# Patient Record
Sex: Female | Born: 1935 | Hispanic: Yes | Marital: Married | Smoking: Never smoker
Health system: Southern US, Community
[De-identification: ages and names within clinical notes are randomized; demographics above are authoritative.]

## PROBLEM LIST (undated history)

## (undated) DIAGNOSIS — E119 Type 2 diabetes mellitus without complications: Secondary | ICD-10-CM

## (undated) DIAGNOSIS — I10 Essential (primary) hypertension: Secondary | ICD-10-CM

## (undated) DIAGNOSIS — N289 Disorder of kidney and ureter, unspecified: Secondary | ICD-10-CM

## (undated) DIAGNOSIS — I639 Cerebral infarction, unspecified: Secondary | ICD-10-CM

## (undated) HISTORY — PX: BRAIN SURGERY: SHX531

---

## 2014-02-20 ENCOUNTER — Encounter (HOSPITAL_BASED_OUTPATIENT_CLINIC_OR_DEPARTMENT_OTHER): Payer: Self-pay

## 2014-02-20 ENCOUNTER — Emergency Department (HOSPITAL_BASED_OUTPATIENT_CLINIC_OR_DEPARTMENT_OTHER)
Admission: EM | Admit: 2014-02-20 | Discharge: 2014-02-20 | Disposition: A | Discharge status: Home / Self Care | Payer: Medicare HMO | Attending: Emergency Medicine | Admitting: Emergency Medicine

## 2014-02-20 ENCOUNTER — Emergency Department (HOSPITAL_BASED_OUTPATIENT_CLINIC_OR_DEPARTMENT_OTHER): Payer: Medicare HMO

## 2014-02-20 DIAGNOSIS — I10 Essential (primary) hypertension: Secondary | ICD-10-CM | POA: Diagnosis not present

## 2014-02-20 DIAGNOSIS — S0990XA Unspecified injury of head, initial encounter: Secondary | ICD-10-CM | POA: Insufficient documentation

## 2014-02-20 DIAGNOSIS — Y9389 Activity, other specified: Secondary | ICD-10-CM | POA: Diagnosis not present

## 2014-02-20 DIAGNOSIS — E119 Type 2 diabetes mellitus without complications: Secondary | ICD-10-CM | POA: Diagnosis not present

## 2014-02-20 DIAGNOSIS — W06XXXA Fall from bed, initial encounter: Secondary | ICD-10-CM | POA: Insufficient documentation

## 2014-02-20 DIAGNOSIS — Y998 Other external cause status: Secondary | ICD-10-CM | POA: Insufficient documentation

## 2014-02-20 DIAGNOSIS — Z79899 Other long term (current) drug therapy: Secondary | ICD-10-CM | POA: Insufficient documentation

## 2014-02-20 DIAGNOSIS — Y9289 Other specified places as the place of occurrence of the external cause: Secondary | ICD-10-CM | POA: Diagnosis not present

## 2014-02-20 DIAGNOSIS — Z8673 Personal history of transient ischemic attack (TIA), and cerebral infarction without residual deficits: Secondary | ICD-10-CM | POA: Insufficient documentation

## 2014-02-20 DIAGNOSIS — W19XXXA Unspecified fall, initial encounter: Secondary | ICD-10-CM

## 2014-02-20 HISTORY — DX: Cerebral infarction, unspecified: I63.9

## 2014-02-20 HISTORY — DX: Type 2 diabetes mellitus without complications: E11.9

## 2014-02-20 HISTORY — DX: Essential (primary) hypertension: I10

## 2014-02-20 LAB — CBG MONITORING, ED: Glucose-Capillary: 158 mg/dL — ABNORMAL HIGH (ref 70–99)

## 2014-02-20 NOTE — ED Provider Notes (Signed)
TIME SEEN: 10:40 AM  CHIEF COMPLAINT: Fall out of bed  HPI: Pt is a 80 y.o. female with history of hemorrhagic stroke with left-sided hemiplegia, hypertension, diabetes who lives with her daughter who fell out of bed this morning on the floor. Daughter reports that she heard a loud crash and then when on stairs and down the patient conscious on the floor. Patient denies any pain in the daughter noted some right-sided conjunctival injection was concerned. She is not on anticoagulation. No new numbness or focal weakness. No chest pain, abdominal pain, neck or back pain. No recent fevers, cough, vomiting or diarrhea. Patient is confused at baseline. Daughter reports she is acting normally.  ROS: Level V caveat for baseline altered mental status  PAST MEDICAL HISTORY/PAST SURGICAL HISTORY:  Past Medical History  Diagnosis Date  . CVA (cerebral infarction)   . Stroke   . Hypertension   . Diabetes mellitus without complication     MEDICATIONS:  Prior to Admission medications   Medication Sig Start Date End Date Taking? Authorizing Provider  atenolol (TENORMIN) 50 MG tablet Take 50 mg by mouth daily.   Yes Historical Provider, MD  atenolol (TENORMIN) 50 MG tablet Take 50 mg by mouth daily.   Yes Historical Provider, MD  atorvastatin (LIPITOR) 40 MG tablet Take 40 mg by mouth daily.   Yes Historical Provider, MD  furosemide (LASIX) 40 MG tablet Take 40 mg by mouth.   Yes Historical Provider, MD  hydrochlorothiazide (HYDRODIURIL) 25 MG tablet Take 25 mg by mouth daily.   Yes Historical Provider, MD  sertraline (ZOLOFT) 25 MG tablet Take 25 mg by mouth daily.   Yes Historical Provider, MD    ALLERGIES:  No Known Allergies  SOCIAL HISTORY:  History  Substance Use Topics  . Smoking status: Never Smoker   . Smokeless tobacco: Not on file  . Alcohol Use: Not on file    FAMILY HISTORY: No family history on file.  EXAM: BP 138/53 mmHg  Pulse 69  Temp(Src) 98.3 F (36.8 C) (Oral)  Resp  18  SpO2 100% CONSTITUTIONAL: Alert and oriented to person only, disoriented to year and place which daughter reports is chronic, responds appropriately to questions intermittently. Well-appearing; well-nourished; GCS 15, in NAD HEAD: Normocephalic; atraumatic EYES: Mild right-sided conjunctival injection, no drainage, no conjunctival injection on the left, no hyphema, PERRL, EOMI, no conjunctival pallor ENT: normal nose; no rhinorrhea; moist mucous membranes; pharynx without lesions noted; no dental injury; no hemotypanum; no septal hematoma NECK: Supple, no meningismus, no LAD; no midline spinal tenderness, step-off or deformity CARD: RRR; S1 and S2 appreciated; no murmurs, no clicks, no rubs, no gallops RESP: Normal chest excursion without splinting or tachypnea; breath sounds clear and equal bilaterally; no wheezes, no rhonchi, no rales; chest wall stable, nontender to palpation ABD/GI: Normal bowel sounds; non-distended; soft, non-tender, no rebound, no guarding PELVIS:  stable, nontender to palpation BACK:  The back appears normal and is non-tender to palpation, there is no CVA tenderness; no midline spinal tenderness, step-off or deformity EXT: Normal ROM in all joints; non-tender to palpation; no edema; normal capillary refill; no cyanosis    SKIN: Normal color for age and race; warm NEURO: Left-sided hemiplegia that is chronic, otherwise moves right side normally, no cranial nerve deficit, no dysarthria or aphasia, reports normal sensation diffusely PSYCH: The patient's mood and manner are appropriate. Grooming and personal hygiene are appropriate.  MEDICAL DECISION MAKING: Patient here with a fall out of bed. Patient's daughter  reports she heard a loud crash and then found her on the floor. She is at her neurologic baseline without complaints of pain. Given her age and comorbidities will obtain a CT of her head and cervical spine. Daughter is concerned that her blood sugar may be off that  she recently had a colonoscopy and would like for [place] to check this today. We'll check a CBG. Patient does not appear dehydrated on exam and has been eating and drinking well. Her abdominal exam is completely benign.  ED PROGRESS: Patient's blood glucose is normal. Imaging shows no acute abnormality. On repeat exam of her eyes she states this has very mild right-sided conjunctival injection but no other signs or symptoms to suggest corneal abrasion, conjunctivitis. I do not feel this time she needs to be a antibiotics. Reports no eye pain. Discussed head injury return precautions. Patient's daughter verbalized understanding and is comfortable with plan.     Shannon Maw Isiac Breighner, DO 02/20/14 1530

## 2014-02-20 NOTE — Discharge Instructions (Signed)
Lesin en la cabeza (Head Injury) Ha sufrido una lesin en la cabeza. En este momento no parece ser de gravedad. Los dolores de Turkmenistancabeza y los vmitos son frecuentes luego de este tipo de lesiones. Si se duerme, debera resultar fcil despertarlo. A veces, es necesario que permanezca en la sala de emergencia durante un tiempo para su observacin. Tambin puede ser necesario hospitalizarlo. Despus de lesiones como la que usted sufri, la mayora de los problemas ocurre dentro de las primeras 24horas, pero los efectos secundarios pueden aparecer entre 7 y 10das despus de la lesin. Es importante que controle cuidadosamente su problema y que se comunique con su mdico o busque atencin mdica de inmediato si observa algn cambio en su estado. CULES SON LOS TIPOS DE LESIONES EN LA CABEZA? Las lesiones en la cabeza pueden ser leves y provocar un bulto. Algunas lesiones en la cabeza pueden ser ms graves. Algunas de las lesiones graves en la cabeza son:  Helene KelpLesin agresiva en el cerebro (conmocin).  Hematoma en el cerebro (contusin). Esto significa que hay hemorragia en el cerebro que puede causar un edema.  Fisura en el crneo (fractura de crneo).  Hemorragia en el cerebro que junta sangre, coagula y forma un bulto (hematoma). CULES SON LAS CAUSAS DE UNA LESIN EN LA CABEZA? Es ms probable que una lesin en la cabeza grave le ocurra a alguien que sufre un accidente automovilstico y no est usando el cinturn de seguridad. Otras causas de lesiones importantes en la cabeza incluyen accidentes en bicicleta o motocicleta, lesiones deportivas y cadas. CMO SE DIAGNOSTICAN LAS LESIONES EN LA CABEZA? Un historial completo del evento que deriv en la lesin y sus sntomas actuales sern tiles para el diagnstico de lesiones en la cabeza. Muchas veces, se necesitar tomar imgenes del cerebro, como tomografa computarizada o resonancia magntica, para conocer la magnitud de la lesin. A menudo se debe  pasar una noche entera en el hospital para observacin.  CUNDO DEBO BUSCAR ASISTENCIA MDICA INMEDIATA?  Debe obtener ayuda de inmediato en los siguientes casos:  Est confundido o somnoliento.  Siente Programme researcher, broadcasting/film/videomalestar estomacal (nuseas) o tiene vmitos constantes y forzosos.  Nota que los mareos o la inestabilidad empeoran.  Siente dolores de Turkmenistancabeza intensos y persistentes que no se Copyalivian con los medicamentos. Utilice los medicamentos de venta libre o recetados para Primary school teachercalmar el dolor, el malestar o la fiebre, segn se lo indique el mdico.  Las piernas o los brazos no funcionan normalmente o no Hydrographic surveyorpuede caminar.  Observa cambios en los puntos negros en el centro de la parte de color del ojo (pupila).  Presenta una secrecin clara o con sangre que proviene de la nariz o de los odos.  Sufre prdida de la visin. Durante las prximas 24horas posteriores a la lesin, Office managerdebe permanecer con Designer, industrial/productalguna persona que pueda cuidarlo y estar atento a los signos de Customer service manageradvertencia. Esta persona debe comunicarse con el servicio de emergencias de su localidad (911 en los EE[place] si usted tiene convulsiones, est inconsciente o no puede despertarse. CMO PUEDO PREVENIR UNA LESIN EN LA CABEZA EN EL FUTURO? El factor ms importante para prevenir lesiones en la cabeza de gravedad es evitar los accidentes en vehculos a motor. Para minimizar el dao potencial a la cabeza, es crucial usar cinturones de seguridad. Tambin es til usar casco si anda en bicicleta y Therapist, occupationalpractica deportes de contacto (como el ftbol Public house manageramericano). Adems, evite las actividades peligrosas en su casa para ayudar a reducir el riesgo de sufrir una lesin en  la cabeza.  CUNDO PUEDO RETOMAR LAS ACTIVIDADES NORMALES Y EL ATLETISMO? Antes de retomar estas actividades, su mdico debe volver a evaluarlo. Si presenta alguno de los siguientes sntomas, no podr retomar sus actividades ni volver a Microbiologistpracticar deportes de contacto hasta una semana despus de que los  sntomas hayan desaparecido:  Dolor de cabeza persistente.  Mareos o vrtigo.  Falta de atencin y Librarian, academicconcentracin.  Confusin.  Problemas de memoria.  Nuseas o vmitos.  Siente fatiga o se cansa fcilmente.  Irritabilidad.  Intolerancia a la luz brillante y a los ruidos fuertes.  Ansiedad o depresin.  Trastornos del sueo ASEGRESE DE QUE:   Comprende estas instrucciones.  Controlar su afeccin.  Recibir ayuda de inmediato si no mejora o si empeora. Document Released: 01/22/2005 Document Revised: 01/27/2013 [facility] CenterExitCare Patient Information 2015 [place]Care, [place]. This information is not intended to replace advice given to you by your health care provider. Make sure you discuss any questions you have with your health care provider.  Prevencin de las cadas y seguridad en Advice workerel hogar  (Fall Prevention and Financial risk analystHome Safety)  Las cadas causan lesiones y Futures traderpueden afectar a personas de todas las edades. Es posible prevenir las cadas.  CMO PREVENIR LAS CADAS   Use zapatos con suela de goma que no tengan abertura para los dedos del pie.  Mantenga el interior y Dance movement psychotherapistel exterior de su casa bien iluminada.  Utilice luces de noche en su hogar.  No acumule desorden en los pisos.  Limpie los derrames del suelo.  Retire las alfombras o fjelas al suelo con cinta adhesiva para alfombras.  No  colocar cables elctricos en los pasillos.  Coloque agarraderas en la baera, la ducha y el inodoro. No utilizar toalleros como barras de agarre.  Coloque barandas a ambos lados de Dispensing opticianla escalera. Arregle las barandas sueltas.  No se suba a bancos o escaleras de tijera, en lo posible.  No encere los pisos.  Repare aceras, calzadas o escaleras irregulares o inseguras.  Mantenga los artculos que [place]util[place] habitualmente dentro de su alcance.  Tenga cuidado con las [employer].  Tenga los nmeros de emergencia cerca del telfono.  Coloque detectores de humo en su casa y cerca de los  dormitorios. Pregntele a su mdico qu otras cosas puede hacer para prevenir las cadas.  Document Released: 07/24/2011 [facility]ExitCare Patient Information 2015 [place]ExitCare, [place]LLC. This information is not intended to replace advice given to you by your health care provider. Make sure you discuss any questions you have with your health care provider.

## 2014-02-20 NOTE — ED Notes (Addendum)
Patient here with daughter who found patient in floor this am. Patient had Stroke in 2013 and has some deficits from same. On arrival no deficts noted, patient denies pain. Confused about todays date but daughter reports that she is sometimes confused with same. Right eye redness noted

## 2014-10-09 ENCOUNTER — Emergency Department (HOSPITAL_BASED_OUTPATIENT_CLINIC_OR_DEPARTMENT_OTHER)
Admission: EM | Admit: 2014-10-09 | Discharge: 2014-10-09 | Disposition: A | Discharge status: Home / Self Care | Payer: Medicare HMO | Attending: Emergency Medicine | Admitting: Emergency Medicine

## 2014-10-09 ENCOUNTER — Encounter (HOSPITAL_BASED_OUTPATIENT_CLINIC_OR_DEPARTMENT_OTHER): Payer: Self-pay | Admitting: Emergency Medicine

## 2014-10-09 ENCOUNTER — Emergency Department (HOSPITAL_BASED_OUTPATIENT_CLINIC_OR_DEPARTMENT_OTHER): Payer: Medicare HMO

## 2014-10-09 DIAGNOSIS — I10 Essential (primary) hypertension: Secondary | ICD-10-CM | POA: Diagnosis not present

## 2014-10-09 DIAGNOSIS — R200 Anesthesia of skin: Secondary | ICD-10-CM | POA: Insufficient documentation

## 2014-10-09 DIAGNOSIS — N390 Urinary tract infection, site not specified: Secondary | ICD-10-CM | POA: Diagnosis not present

## 2014-10-09 DIAGNOSIS — R21 Rash and other nonspecific skin eruption: Secondary | ICD-10-CM | POA: Diagnosis present

## 2014-10-09 DIAGNOSIS — R5383 Other fatigue: Secondary | ICD-10-CM | POA: Diagnosis not present

## 2014-10-09 DIAGNOSIS — R630 Anorexia: Secondary | ICD-10-CM | POA: Diagnosis not present

## 2014-10-09 DIAGNOSIS — Z79899 Other long term (current) drug therapy: Secondary | ICD-10-CM | POA: Diagnosis not present

## 2014-10-09 DIAGNOSIS — B019 Varicella without complication: Secondary | ICD-10-CM | POA: Insufficient documentation

## 2014-10-09 DIAGNOSIS — R0602 Shortness of breath: Secondary | ICD-10-CM | POA: Insufficient documentation

## 2014-10-09 DIAGNOSIS — E119 Type 2 diabetes mellitus without complications: Secondary | ICD-10-CM | POA: Diagnosis not present

## 2014-10-09 DIAGNOSIS — B0189 Other varicella complications: Secondary | ICD-10-CM

## 2014-10-09 DIAGNOSIS — M791 Myalgia: Secondary | ICD-10-CM | POA: Insufficient documentation

## 2014-10-09 DIAGNOSIS — Z8673 Personal history of transient ischemic attack (TIA), and cerebral infarction without residual deficits: Secondary | ICD-10-CM | POA: Insufficient documentation

## 2014-10-09 LAB — CBC WITH DIFFERENTIAL/PLATELET
BASOS PCT: 0 % (ref 0–1)
Basophils Absolute: 0 10*3/uL (ref 0.0–0.1)
Eosinophils Absolute: 0.1 10*3/uL (ref 0.0–0.7)
Eosinophils Relative: 2 % (ref 0–5)
HCT: 36.8 % (ref 36.0–46.0)
Hemoglobin: 11.9 g/dL — ABNORMAL LOW (ref 12.0–15.0)
LYMPHS ABS: 1.6 10*3/uL (ref 0.7–4.0)
LYMPHS PCT: 35 % (ref 12–46)
MCH: 29.7 pg (ref 26.0–34.0)
MCHC: 32.3 g/dL (ref 30.0–36.0)
MCV: 91.8 fL (ref 78.0–100.0)
Monocytes Absolute: 0.9 10*3/uL (ref 0.1–1.0)
Monocytes Relative: 19 % — ABNORMAL HIGH (ref 3–12)
NEUTROS PCT: 43 % (ref 43–77)
Neutro Abs: 1.9 10*3/uL (ref 1.7–7.7)
PLATELETS: 123 10*3/uL — AB (ref 150–400)
RBC: 4.01 MIL/uL (ref 3.87–5.11)
RDW: 13.8 % (ref 11.5–15.5)
WBC: 4.4 10*3/uL (ref 4.0–10.5)

## 2014-10-09 LAB — BASIC METABOLIC PANEL
ANION GAP: 7 (ref 5–15)
BUN: 27 mg/dL — ABNORMAL HIGH (ref 6–20)
CO2: 29 mmol/L (ref 22–32)
Calcium: 8.4 mg/dL — ABNORMAL LOW (ref 8.9–10.3)
Chloride: 102 mmol/L (ref 101–111)
Creatinine, Ser: 1.26 mg/dL — ABNORMAL HIGH (ref 0.44–1.00)
GFR calc Af Amer: 46 mL/min — ABNORMAL LOW (ref >60)
GFR, EST NON AFRICAN AMERICAN: 40 mL/min — AB (ref >60)
GLUCOSE: 144 mg/dL — AB (ref 65–99)
POTASSIUM: 4 mmol/L (ref 3.5–5.1)
Sodium: 138 mmol/L (ref 135–145)

## 2014-10-09 LAB — URINALYSIS, ROUTINE W REFLEX MICROSCOPIC
Bilirubin Urine: NEGATIVE
Glucose, UA: NEGATIVE mg/dL
Ketones, ur: NEGATIVE mg/dL
NITRITE: NEGATIVE
PH: 5 (ref 5.0–8.0)
Protein, ur: NEGATIVE mg/dL
SPECIFIC GRAVITY, URINE: 1.016 (ref 1.005–1.030)
Urobilinogen, UA: 0.2 mg/dL (ref 0.0–1.0)

## 2014-10-09 LAB — URINE MICROSCOPIC-ADD ON

## 2014-10-09 MED ORDER — CLOBETASOL PROP EMOLLIENT BASE 0.05 % EX CREA: 60.0000 g | TOPICAL_CREAM | Freq: Two times a day (BID) | CUTANEOUS | Status: DC

## 2014-10-09 MED ORDER — LEVOFLOXACIN 500 MG PO TABS: 500.0000 mg | ORAL_TABLET | Freq: Every day | ORAL | Status: AC

## 2014-10-09 NOTE — ED Provider Notes (Signed)
CSN: [id number]     Arrival date & time 10/09/14  1309 History   First MD Initiated Contact with Patient 10/09/14 1334     Chief Complaint  Patient presents with  . Rash  . Fever   HPI   Very friendly 80 year old lady h/o [facility] June 2015 presents with low grade fever of 99.9, malaise, and new-onset blistering rash on trunk and arms; all symptoms started last night. Her daughter translated our conversation. She denies any new medications and has never had a rash like this before. She has never had chicken pox before. She lives with her daughter and attends an elderly daycare but has not been near children anytime recently.  Past Medical History  Diagnosis Date  . CVA (cerebral infarction)   . Stroke   . Hypertension   . Diabetes mellitus without complication    History reviewed. No pertinent past surgical history. History reviewed. No pertinent family history. Social History  Substance Use Topics  . Smoking status: Never Smoker   . Smokeless tobacco: None  . Alcohol Use: None   OB History    No data available     Review of Systems  Constitutional: Positive for fever, activity change, appetite change and fatigue.  Respiratory: Positive for shortness of breath. Negative for cough, chest tightness and wheezing.   Cardiovascular: Negative for chest pain.  Gastrointestinal: Negative for nausea, vomiting, abdominal pain and diarrhea.  Genitourinary: Negative for dysuria and pelvic pain.  Musculoskeletal: Positive for myalgias.  Skin: Positive for rash.  Neurological: Positive for numbness. Negative for light-headedness.    Allergies  Review of patient's allergies indicates no known allergies.  Home Medications   Prior to Admission medications   Medication Sig Start Date End Date Taking? Authorizing Provider  atenolol (TENORMIN) 50 MG tablet Take 50 mg by mouth daily.    Historical Provider, MD  atorvastatin (LIPITOR) 40 MG tablet Take 40 mg by mouth daily.    Historical  Provider, MD  furosemide (LASIX) 40 MG tablet Take 40 mg by mouth.    Historical Provider, MD  hydrochlorothiazide (HYDRODIURIL) 25 MG tablet Take 25 mg by mouth daily.    Historical Provider, MD  sertraline (ZOLOFT) 25 MG tablet Take 25 mg by mouth daily.    Historical Provider, MD   BP 136/47 mmHg  Pulse 65  Temp(Src) 98.7 F (37.1 C) (Oral)  Resp 18  SpO2 95%   Physical Exam  Constitutional: She appears well-developed and well-nourished.  HENT:  Head: Normocephalic and atraumatic.  Eyes: Conjunctivae and EOM are normal. Pupils are equal, round, and reactive to light.  Neck: Normal range of motion. Neck supple.  Cardiovascular: Normal rate, regular rhythm and normal heart sounds.   Pulmonary/Chest: Effort normal. She has rales.  Abdominal: Soft. Bowel sounds are normal.  Skin:  Multiple vesicles on erythematous bases distributed on neck, trunk, and extremities. No oral lesions. No nail changes.    ED Course  Procedures (including critical care time)  Labs Review Labs Reviewed  URINALYSIS, ROUTINE W REFLEX MICROSCOPIC (NOT AT [facility]) - Abnormal; Notable for the following:    APPearance CLOUDY (*)    Hgb urine dipstick LARGE (*)    Leukocytes, UA SMALL (*)    All other components within normal limits  CBC WITH DIFFERENTIAL/PLATELET - Abnormal; Notable for the following:    Hemoglobin 11.9 (*)    Platelets 123 (*)    Monocytes Relative 19 (*)    All other components within normal limits  BASIC METABOLIC PANEL - Abnormal; Notable for the following:    Glucose, Bld 144 (*)    BUN 27 (*)    Creatinine, Ser 1.26 (*)    Calcium 8.4 (*)    GFR calc non Af Amer 40 (*)    GFR calc Af Amer 46 (*)    All other components within normal limits  URINE MICROSCOPIC-ADD ON - Abnormal; Notable for the following:    Squamous Epithelial / LPF FEW (*)    Bacteria, UA FEW (*)    All other components within normal limits    Imaging Review No results found. I have personally reviewed  and evaluated these images and lab results as part of my medical decision-making.  EKG: Sinus bradycardia, no ST changes  MDM   Final diagnoses:  Bullous dermatosis   Ms. Hermie Reagor is a very friendly spanish-speaking 80 year old lady with a history of subarachnoid hemorrhage presenting with one day history of low grade fever, malaise, and diffuse vesicles on an erythematous base, concerning for chicken pox versus bullous pemphigus. I favor the former diagnosis given her systemic symptoms, morphology of lesions being more vesicular than bullous, and the fact that she is [place] and has reportedly never had chicken pox before. VZV in elderly patients has a strong association with pneumonia so she will need close follow-up. Giant cell temporal arteritis is also  thus this diagnosis should be considered if she develops change is vision or temporal headache with tender temporal artery. Bullous pemphigoid is another consideration giver her age, morphology of tense vesicles, and urticarial lesions that may represent new lesions that have not yet blistered.  She also had a urinalysis with moderate leukocytes and CXR showing LLL atelectasis versus consolidation. Will rx levofloxacin for 5 days empirically to cover UTI and pneumonia.  Selina Cooley, MD 10/09/14 1610  Rolan Bucco, MD 10/10/14 1018

## 2014-10-09 NOTE — ED Notes (Signed)
Pt in with rash noted to anterior and posterior trunk and face, daughter states her temp was 99.9 this am. Daughter also states pt was dizzy this am but is able to ambulate. Pt with hx of hemorrhagic stroke in 2013, mild residual deficits to L side. Pt is neurologically intact at this time with no facial droop, new unilateral weakness, dysarthria or aphasia.

## 2014-10-09 NOTE — ED Notes (Signed)
MD at bedside. 

## 2014-10-09 NOTE — ED Notes (Signed)
Pt daughter states she gave pt two Tylenol tablets this around 1030 am when she discovered the fever.

## 2014-10-09 NOTE — ED Notes (Signed)
Daughter states mother has onset of rash since last PM, all over, trunk, arms, chin. Also has had poor appetite

## 2015-01-06 ENCOUNTER — Encounter (HOSPITAL_BASED_OUTPATIENT_CLINIC_OR_DEPARTMENT_OTHER): Payer: Self-pay

## 2015-01-06 ENCOUNTER — Emergency Department (HOSPITAL_BASED_OUTPATIENT_CLINIC_OR_DEPARTMENT_OTHER)
Admission: EM | Admit: 2015-01-06 | Discharge: 2015-01-06 | Disposition: A | Discharge status: Home / Self Care | Payer: Medicare HMO | Attending: Emergency Medicine | Admitting: Emergency Medicine

## 2015-01-06 DIAGNOSIS — I129 Hypertensive chronic kidney disease with stage 1 through stage 4 chronic kidney disease, or unspecified chronic kidney disease: Secondary | ICD-10-CM | POA: Insufficient documentation

## 2015-01-06 DIAGNOSIS — Z8673 Personal history of transient ischemic attack (TIA), and cerebral infarction without residual deficits: Secondary | ICD-10-CM | POA: Diagnosis not present

## 2015-01-06 DIAGNOSIS — I10 Essential (primary) hypertension: Secondary | ICD-10-CM

## 2015-01-06 DIAGNOSIS — N183 Chronic kidney disease, stage 3 (moderate): Secondary | ICD-10-CM | POA: Diagnosis not present

## 2015-01-06 DIAGNOSIS — R350 Frequency of micturition: Secondary | ICD-10-CM | POA: Diagnosis not present

## 2015-01-06 DIAGNOSIS — N289 Disorder of kidney and ureter, unspecified: Secondary | ICD-10-CM | POA: Insufficient documentation

## 2015-01-06 DIAGNOSIS — E119 Type 2 diabetes mellitus without complications: Secondary | ICD-10-CM | POA: Diagnosis not present

## 2015-01-06 HISTORY — DX: Disorder of kidney and ureter, unspecified: N28.9

## 2015-01-06 LAB — URINALYSIS, ROUTINE W REFLEX MICROSCOPIC
Bilirubin Urine: NEGATIVE
GLUCOSE, UA: NEGATIVE mg/dL
KETONES UR: NEGATIVE mg/dL
Nitrite: NEGATIVE
PROTEIN: NEGATIVE mg/dL
Specific Gravity, Urine: 1.011 (ref 1.005–1.030)
pH: 7 (ref 5.0–8.0)

## 2015-01-06 LAB — CBC WITH DIFFERENTIAL/PLATELET
BASOS ABS: 0 10*3/uL (ref 0.0–0.1)
BASOS PCT: 1 %
EOS ABS: 0.3 10*3/uL (ref 0.0–0.7)
EOS PCT: 3 %
HCT: 39.2 % (ref 36.0–46.0)
Hemoglobin: 12.8 g/dL (ref 12.0–15.0)
Lymphocytes Relative: 32 %
Lymphs Abs: 2.6 10*3/uL (ref 0.7–4.0)
MCH: 29.4 pg (ref 26.0–34.0)
MCHC: 32.7 g/dL (ref 30.0–36.0)
MCV: 90.1 fL (ref 78.0–100.0)
MONO ABS: 0.7 10*3/uL (ref 0.1–1.0)
Monocytes Relative: 8 %
Neutro Abs: 4.7 10*3/uL (ref 1.7–7.7)
Neutrophils Relative %: 56 %
PLATELETS: 220 10*3/uL (ref 150–400)
RBC: 4.35 MIL/uL (ref 3.87–5.11)
RDW: 14.2 % (ref 11.5–15.5)
WBC: 8.3 10*3/uL (ref 4.0–10.5)

## 2015-01-06 LAB — BASIC METABOLIC PANEL
ANION GAP: 12 (ref 5–15)
BUN: 27 mg/dL — ABNORMAL HIGH (ref 6–20)
CO2: 26 mmol/L (ref 22–32)
Calcium: 9.6 mg/dL (ref 8.9–10.3)
Chloride: 102 mmol/L (ref 101–111)
Creatinine, Ser: 1.05 mg/dL — ABNORMAL HIGH (ref 0.44–1.00)
GFR, EST AFRICAN AMERICAN: 57 mL/min — AB (ref >60)
GFR, EST NON AFRICAN AMERICAN: 50 mL/min — AB (ref >60)
Glucose, Bld: 106 mg/dL — ABNORMAL HIGH (ref 65–99)
POTASSIUM: 4.3 mmol/L (ref 3.5–5.1)
SODIUM: 140 mmol/L (ref 135–145)

## 2015-01-06 LAB — URINE MICROSCOPIC-ADD ON

## 2015-01-06 NOTE — ED Notes (Signed)
Daughter reports patient with HTN over the past three days, 200/90, despite home antihypertensives. Denies falling, walking with walker at home, reports intermittent headache. Denies chest pain. Daughter reports giving extra antihypertensives over the past two days in attempts to control blood pressure that has been elevated in the evening. Pt has had her antihypertensives this morning.

## 2015-01-06 NOTE — ED Provider Notes (Signed)
CSN: [id number]     Arrival date & time 01/06/15  [id number] History   First MD Initiated Contact with Patient 01/06/15 0902     Chief Complaint  Patient presents with  . Hypertension     (Consider location/radiation/quality/duration/timing/severity/associated sxs/prior Treatment) HPI Comments: Has not changed her medication and daughter has made sure she gets all he meds.  Patient is a 80 y.o. female presenting with hypertension. The history is provided by a caregiver. The history is limited by a language barrier. A language interpreter was used.  Hypertension This is a new problem. Episode onset: 3 days. The problem occurs constantly. The problem has not changed since onset.Associated symptoms include headaches. Pertinent negatives include no chest pain, no abdominal pain and no shortness of breath. Associated symptoms comments: No N/V.  Always has urinary frequency due to taking lasix but nothing new.  No dysuria.  Appetite is normal and no recent swelling or weight gain.  Pt's diet has been different over the holidays.  Daughter states she has been eating a lot of stuff from thanksgiving that she ordinarily does not eat.  Chronic left sided weakness but no new deficits.  Speaking and swallowing without difficulty.  Normal vision.. Nothing aggravates the symptoms. Nothing relieves the symptoms. She has tried nothing for the symptoms. The treatment provided no relief.    Past Medical History  Diagnosis Date  . CVA (cerebral infarction)   . Stroke (HCC)   . Hypertension   . Diabetes mellitus without complication (HCC)   . Renal disorder     stg 3   Past Surgical History  Procedure Laterality Date  . Brain surgery     History reviewed. No pertinent family history. Social History  Substance Use Topics  . Smoking status: Never Smoker   . Smokeless tobacco: None  . Alcohol Use: No   OB History    No data available     Review of Systems  Respiratory: Negative for shortness of breath.    Cardiovascular: Negative for chest pain.  Gastrointestinal: Negative for abdominal pain.  Neurological: Positive for headaches.  All other systems reviewed and are negative.     Allergies  Review of patient's allergies indicates no known allergies.  Home Medications   Prior to Admission medications   Medication Sig Start Date End Date Taking? Authorizing Provider  atenolol (TENORMIN) 50 MG tablet Take 50 mg by mouth daily.   Yes Historical Provider, MD  atorvastatin (LIPITOR) 40 MG tablet Take 40 mg by mouth daily.   Yes Historical Provider, MD  hydrochlorothiazide (HYDRODIURIL) 25 MG tablet Take 25 mg by mouth daily.   Yes Historical Provider, MD  furosemide (LASIX) 40 MG tablet Take 40 mg by mouth.    Historical Provider, MD  sertraline (ZOLOFT) 25 MG tablet Take 25 mg by mouth daily.    Historical Provider, MD   BP 173/63 mmHg  Pulse 60  Temp(Src) 98.1 F (36.7 C) (Oral)  Resp 18  SpO2 100% Physical Exam  Constitutional: She is oriented to person, place, and time. She appears well-developed and well-nourished. No distress.  HENT:  Head: Normocephalic and atraumatic.  Mouth/Throat: Oropharynx is clear and moist.  Eyes: Conjunctivae and EOM are normal. Pupils are equal, round, and reactive to light.  Neck: Normal range of motion. Neck supple.  Cardiovascular: Normal rate, regular rhythm and intact distal pulses.   No murmur heard. Pulmonary/Chest: Effort normal and breath sounds normal. No respiratory distress. She has no wheezes. She has no rales.  Abdominal: Soft. She exhibits no distension. There is no tenderness. There is no rebound and no guarding.  Musculoskeletal: Normal range of motion. She exhibits no edema or tenderness.  Neurological: She is alert and oriented to person, place, and time.  4/5 strength in the left upper and lower ext.  5/5 strength on the right.  Normal speech.  Skin: Skin is warm and dry. No rash noted. No erythema.  Psychiatric: She has a  normal mood and affect. Her behavior is normal.  Nursing note and vitals reviewed.   ED Course  Procedures (including critical care time) Labs Review Labs Reviewed  BASIC METABOLIC PANEL - Abnormal; Notable for the following:    Glucose, Bld 106 (*)    BUN 27 (*)    Creatinine, Ser 1.05 (*)    GFR calc non Af Amer 50 (*)    GFR calc Af Amer 57 (*)    All other components within normal limits  CBC WITH DIFFERENTIAL/PLATELET  URINALYSIS, ROUTINE W REFLEX MICROSCOPIC (NOT AT [facility])    Imaging Review No results found. I have personally reviewed and evaluated these images and lab results as part of my medical decision-making.   EKG Interpretation None      MDM   Final diagnoses:  Essential hypertension   patient is a 80 year old female being brought in by her daughter today for persistent high blood pressure over the last 3 days. Her daughter is responsible for giving her her medications and she has not missed any doses. None of her medications have been changed recently. Patient's only complaint over the last few days is a mild headache. The cause of patient's hypertension is most likely related to diet and increased salt intake. Her daughter states over the Thanksgiving holiday she ate a lot of food she normally doesn't have. Patient has had all of her medications today and currently blood pressure is 173/63. Daughter states typically her blood pressure is between 115 to 140 and over the last 3 days it has been 200 systolic.  Patient denies any chest pain, shortness of breath, abdominal pain, nausea, vomiting or changes in her urine. She has chronic left-sided weakness from a former hemorrhagic stroke.  CBC, BMP and UA pending to ensure no worsening renal failure as she does have stage III kidney disease as the cause of her new onset hypertension.  10:16 AM Hb and cr are unchanged today.  Repeat BP is 142/59.  This is most likely diet related and informed daughter to make sure she  is eating low salt diet.  To f/u with PCP if despite diet changes BP remains high.  Gwyneth Sprout, MD 01/06/15 1032

## 2015-01-06 NOTE — Discharge Instructions (Signed)
Plan de alimentación DASH  (DASH Eating Plan)  DASH es la sigla en inglés de "Enfoques Alimentarios para Detener la Hipertensión". El plan de alimentación DASH ha demostrado bajar la presión arterial elevada (hipertensión). Los beneficios adicionales para la salud pueden incluir la disminución del riesgo de diabetes mellitus tipo 2, enfermedades cardíacas e ictus. Este plan también puede ayudar a adelgazar.  ¿QUÉ DEBO SABER ACERCA DEL PLAN DE ALIMENTACIÓN DASH?  Para el plan de alimentación DASH, seguirá las siguientes pautas generales:  · Elija los alimentos con un valor porcentual diario de sodio de menos del 5 % (según figura en la etiqueta del alimento).  · Use hierbas o aderezos sin sal, en lugar de sal de mesa o sal marina.  · Consulte al médico o farmacéutico antes de usar sustitutos de la sal.  · Coma productos con bajo contenido de sodio, cuya etiqueta suele decir "bajo contenido de sodio" o "sin agregado de sal".  · Coma alimentos frescos.  · Coma más verduras, frutas y productos lácteos con bajo contenido de grasas.  · Elija los cereales integrales. Busque la palabra "integral" en el primer lugar de la lista de ingredientes.  · Elija el pescado y el pollo o el pavo sin piel más a menudo que las carnes rojas. Limite el consumo de pescado, carne de ave y carne a 6 onzas (170 g) por día.  · Limite el consumo de dulces, postres, azúcares y bebidas azucaradas.  · Elija las grasas saludables para el corazón.  · Limite el consumo de queso a 1 onza (28 g) por día.  · Consuma más comida casera y menos de restaurante, de bufé y comida rápida.  · Limite el consumo de alimentos fritos.  · Cocine los alimentos utilizando métodos que no sean la fritura.  · Limite las verduras enlatadas. Si las consume, enjuáguelas bien para disminuir el sodio.  · Cuando coma en un restaurante, pida que preparen su comida con menos sal o, en lo posible, sin nada de sal.  ¿QUÉ ALIMENTOS PUEDO COMER?  Pida ayuda a un nutricionista para  conocer las necesidades calóricas individuales.  Cereales  Pan de salvado o integral. Arroz integral. Pastas de salvado o integrales. Quinua, trigo burgol y cereales integrales. Cereales con bajo contenido de sodio. Tortillas de harina de maíz o de salvado. Pan de maíz integral. Galletas saladas integrales. Galletas con bajo contenido de sodio.  Vegetales  Verduras frescas o congeladas (crudas, al vapor, asadas o grilladas). Jugos de tomate y verduras con contenido bajo o reducido de sodio. Pasta y salsa de tomate con contenido bajo o reducido de sodio. Verduras enlatadas con bajo contenido de sodio o reducido de sodio.   Frutas  Frutas frescas, en conserva (en su jugo natural) o frutas congeladas.  Carnes y otros productos con proteínas  Carne de res molida (al 85 % o más magra), carne de res de animales alimentados con pastos o carne de res sin la grasa. Pollo o pavo sin piel. Carne de pollo o de pavo molida. Cerdo sin la grasa. Todos los pescados y frutos de mar. Huevos. Porotos, guisantes o lentejas secos. Frutos secos y semillas sin sal. Frijoles enlatados sin sal.  Lácteos  Productos lácteos con bajo contenido de grasas, como leche descremada o al 1 %, quesos reducidos en grasas o al 2 %, ricota con bajo contenido de grasas o queso cottage, o yogur natural con bajo contenido de grasas. Quesos con contenido bajo o reducido de sodio.  Grasas y aceites  Margarinas en barra que no contengan grasas trans.   Mayonesa y aliños para ensaladas livianos o reducidos en grasas (reducidos en sodio). [place]. Aceites de cártamo, oliva o canola. Mantequilla natural de maní o almendra.  Otros  Palomitas de maíz y pretzels sin sal.  Los artículos mencionados arriba pueden no ser una lista completa de las bebidas o los alimentos recomendados. Comuníquese con el nutricionista para conocer más opciones.  ¿QUÉ ALIMENTOS NO SE RECOMIENDAN?  Cereales  Pan blanco. Pastas blancas. Arroz blanco. Pan de maíz refinado. Bagels y  croissants. Galletas saladas que contengan grasas trans.  Vegetales  Vegetales con crema o fritos. Verduras en salsa de queso. Verduras enlatadas comunes. Pasta y salsa de tomate en lata comunes. Jugos comunes de tomate y de verduras.  Frutas  Frutas secas. Fruta enlatada en almíbar liviano o espeso. Jugo de frutas.  Carnes y otros productos con proteínas  Cortes de carne con grasa. Costillas, alas de pollo, tocineta, salchicha, mortadela, salame, chinchulines, tocino, perros calientes, salchichas alemanas y embutidos envasados. Frutos secos y semillas con sal. Frijoles con sal en lata.  Lácteos  Leche entera o al 2 %, crema, mezcla de leche y crema, y queso crema. Yogur entero o endulzado. Quesos o queso azul con alto contenido de grasas. [place]rem[place] no lácteas y coberturas batidas. Quesos procesados, quesos para untar o cuajadas.  Condimentos  Sal de cebolla y ajo, sal condimentada, sal de mesa y sal marina. Salsas en lata y envasadas. Salsa [place]. Salsa tártara. Salsa barbacoa. Salsa teriyaki. Salsa de soja, incluso la que tiene contenido reducido de sodio. Salsa de carne. Salsa de pescado. Salsa de ostras. Salsa rosada. Rábano picante. Ketchup y mostaza. Saborizantes y tiernizantes para carne. Caldo en cubitos. Salsa picante. Salsa tabasco. Adobos. Aderezos para tacos. Salsas.  Grasas y aceites  Mantequilla, margarina en barra, manteca de cerdo, grasa, mantequilla clarificada y grasa de tocino. Aceites de coco, de palmiste o de palma. Aderezos comunes para ensalada.  Otros  Pickles y aceitunas. Palomitas de maíz y pretzels con sal.  Los artículos mencionados arriba pueden no ser una lista completa de las bebidas y los alimentos que se deben evitar. Comuníquese con el nutricionista para obtener más información.  ¿DÓNDE PUEDO ENCONTRAR MÁS INFORMACIÓN?  [employer] del [employer] y [employer] la [employer]re (National Heart, Lung, and Blood Institute): www.nhlbi.nih.gov/health/health-topics/topics/dash/      Esta información no tiene como fin reemplazar el consejo del médico. Asegúrese de hacerle al médico cualquier pregunta que tenga.     Document Released: 01/11/2011 Document Revised: 02/12/2014  Elsevier Interactive Patient Education ©2016 [employer]

## 2016-02-19 IMAGING — DX DG CHEST 2V
2 series · 2 of 2 positions shown · non-contrast
Comparison: None.

CLINICAL DATA: Rash and weakness. History of hemorrhagic stroke in
6255. Mild residual deficits of the left side.

EXAM:
CHEST  2 VIEW

[chest lat]
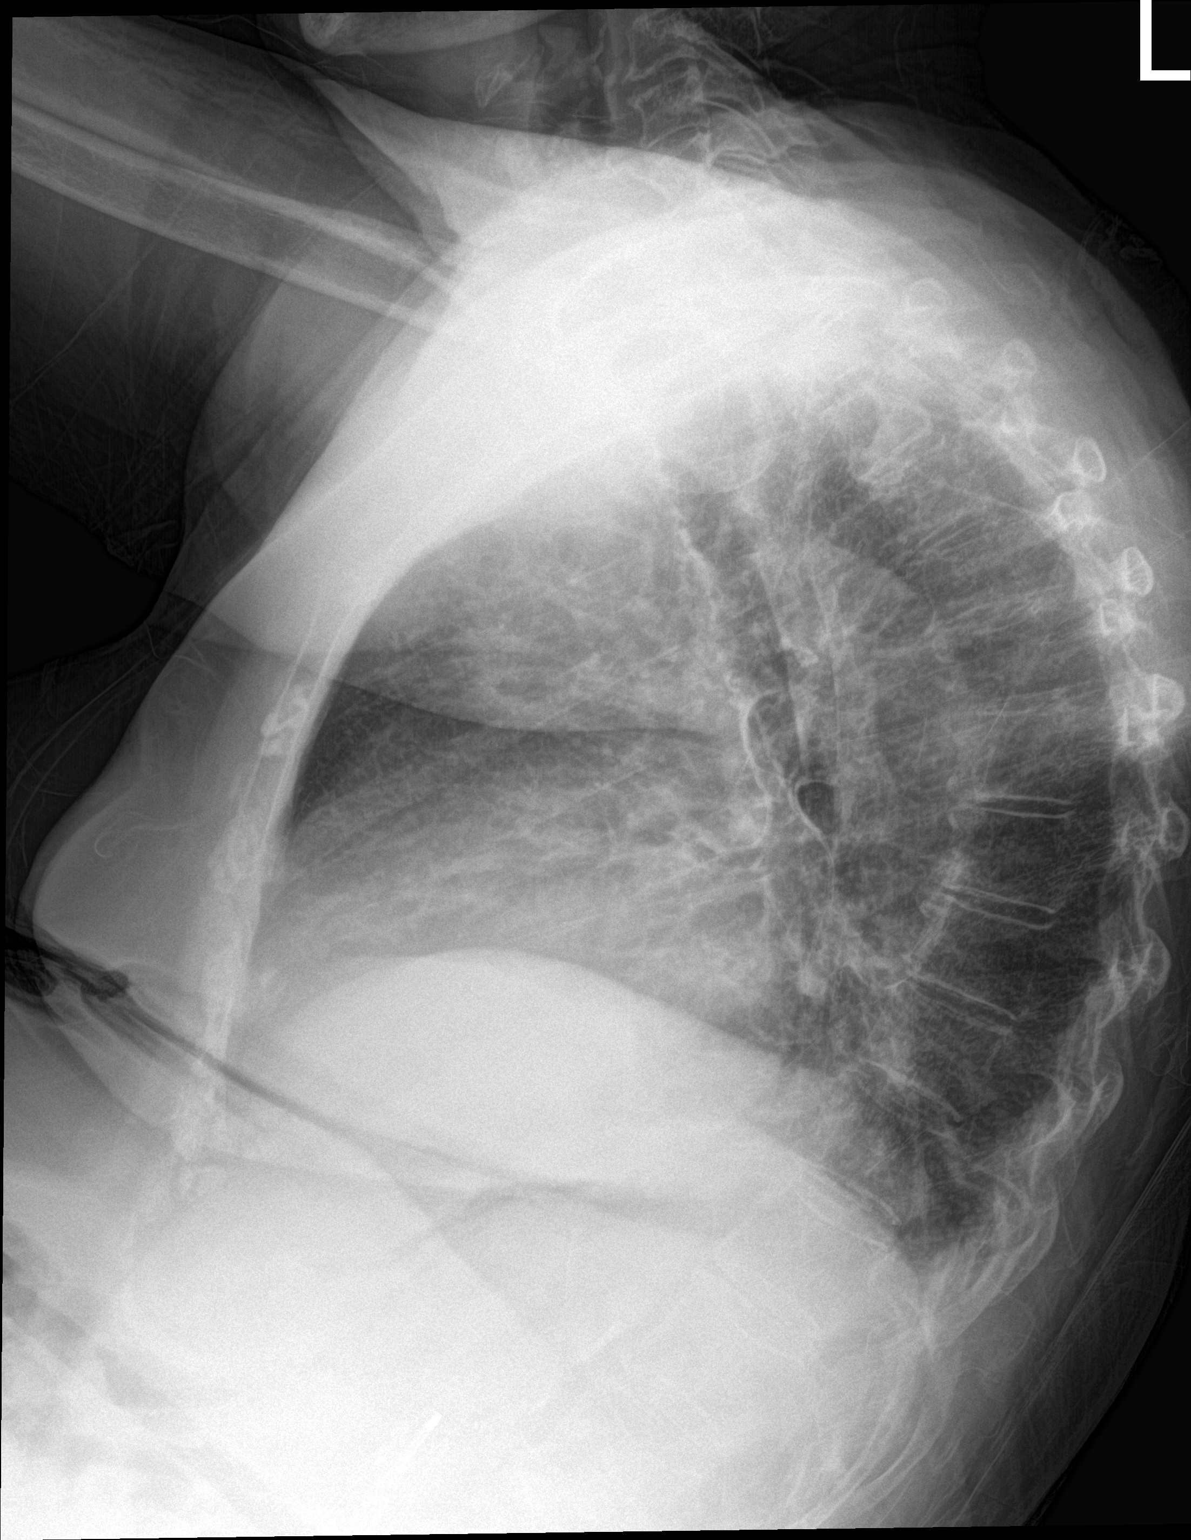

[chest ap]
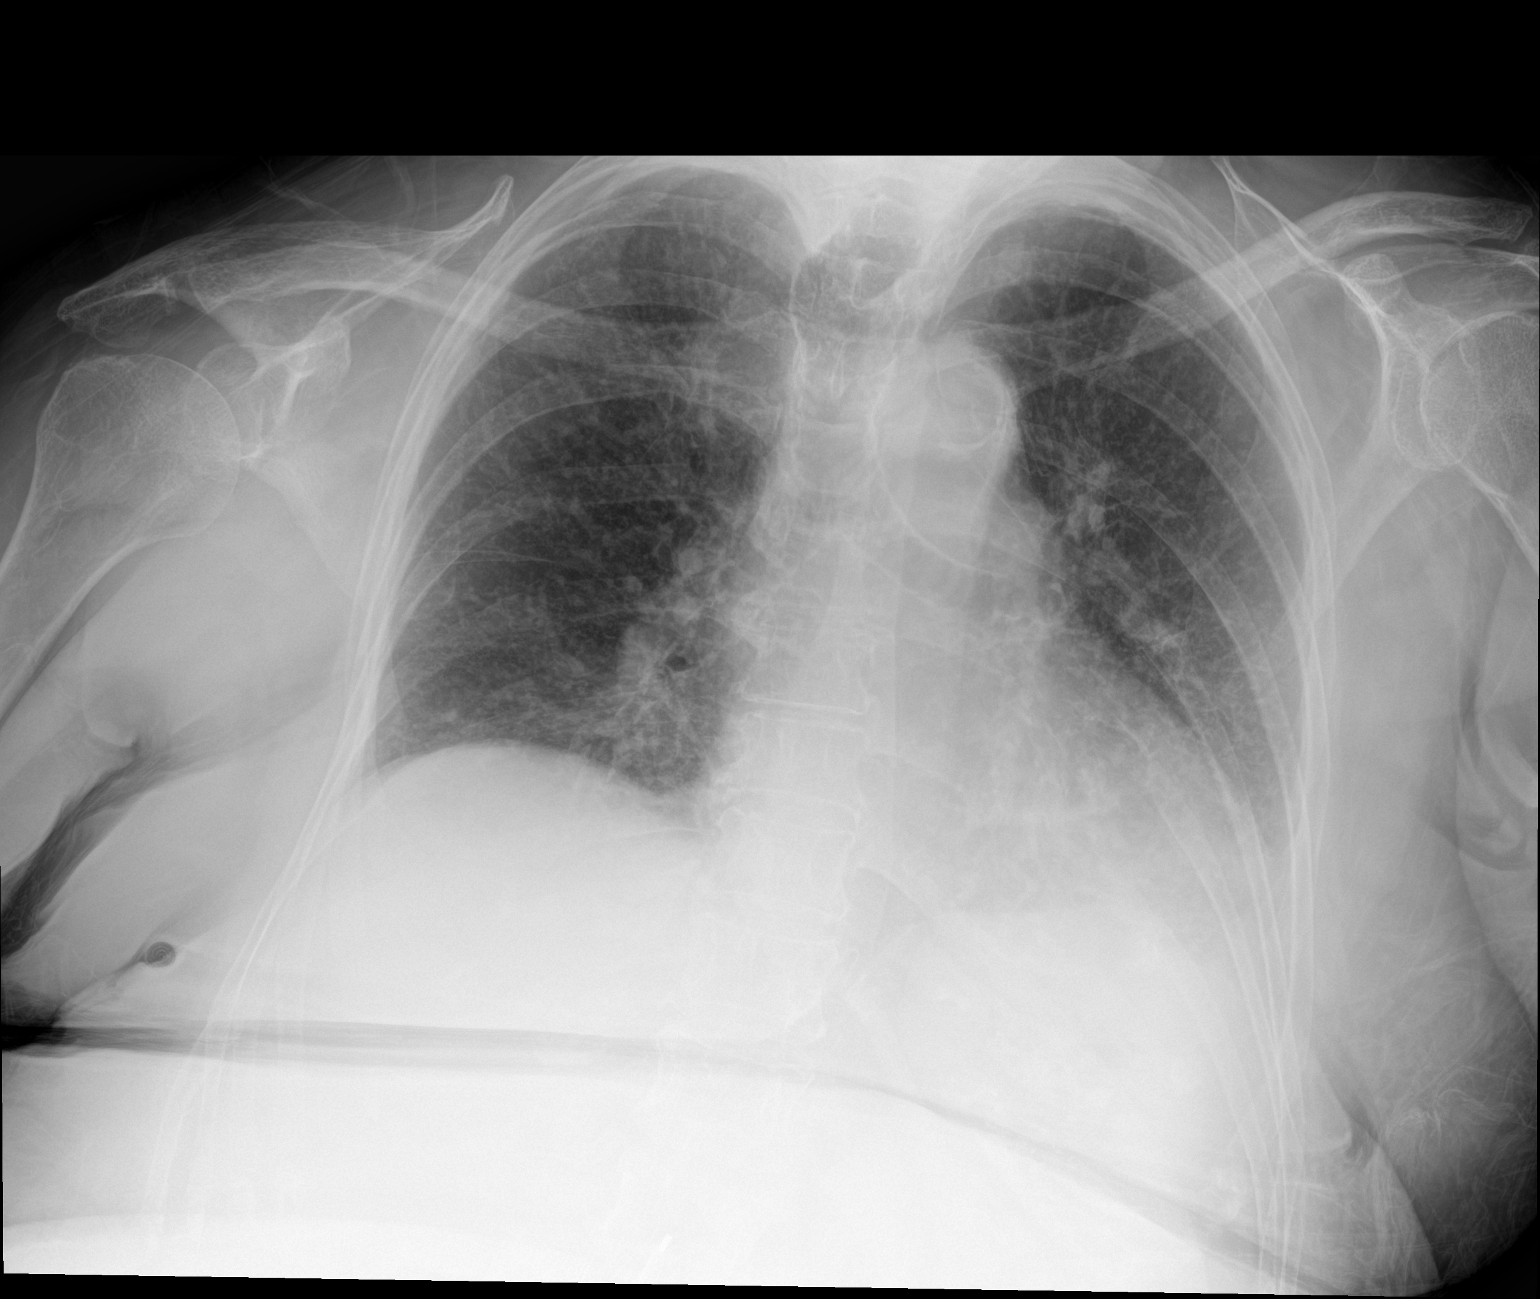

[2 of 2 positions shown; findings below may reference images not displayed]

FINDINGS: Heart is mildly enlarged. There is minimal patchy density at the
left lung base consistent with atelectasis or infiltrate. Suspect
small left pleural effusion. No pulmonary edema. IVC filter is
partially imaged.
IMPRESSION: 1. Mild cardiomegaly without pulmonary edema.
2. Left lower lobe atelectasis or early infiltrate.
3. Suspect left pleural effusion.
